# Patient Record
Sex: Female | Born: 1970 | Race: White | Hispanic: No
Health system: Southern US, Community
[De-identification: ages and names within clinical notes are randomized; demographics above are authoritative.]

## PROBLEM LIST (undated history)

## (undated) DIAGNOSIS — K5909 Other constipation: Secondary | ICD-10-CM

## (undated) DIAGNOSIS — K625 Hemorrhage of anus and rectum: Secondary | ICD-10-CM

## (undated) HISTORY — DX: Other constipation: K59.09

## (undated) HISTORY — DX: Hemorrhage of anus and rectum: K62.5

---

## 2010-05-15 ENCOUNTER — Inpatient Hospital Stay (HOSPITAL_COMMUNITY): Admission: EM | Admit: 2010-05-15 | Discharge: 2010-05-20 | Payer: Self-pay | Admitting: Internal Medicine

## 2010-06-04 ENCOUNTER — Encounter
Admission: RE | Admit: 2010-06-04 | Discharge: 2010-06-04 | Payer: Self-pay | Source: Home / Self Care | Admitting: Neurosurgery

## 2010-07-09 ENCOUNTER — Ambulatory Visit (HOSPITAL_COMMUNITY)
Admission: RE | Admit: 2010-07-09 | Discharge: 2010-07-11 | Payer: Self-pay | Source: Home / Self Care | Attending: Neurosurgery | Admitting: Neurosurgery

## 2010-07-14 LAB — CBC
HCT: 39.4 % (ref 36.0–46.0)
Hemoglobin: 13.1 g/dL (ref 12.0–15.0)
MCH: 27.8 pg (ref 26.0–34.0)
MCHC: 33.2 g/dL (ref 30.0–36.0)
MCV: 83.7 fL (ref 78.0–100.0)
Platelets: 156 10*3/uL (ref 150–400)
RBC: 4.71 MIL/uL (ref 3.87–5.11)
RDW: 13.9 % (ref 11.5–15.5)
WBC: 8.1 10*3/uL (ref 4.0–10.5)

## 2010-07-14 LAB — SURGICAL PCR SCREEN
MRSA, PCR: NEGATIVE
Staphylococcus aureus: NEGATIVE

## 2010-07-19 NOTE — Op Note (Signed)
  Rhonda Hendricks, Rhonda Hendricks                   ACCOUNT NO.:  [id number]  MEDICAL RECORD NO.:  [id number]          PATIENT TYPE:  OIB  LOCATION:  3535                         FACILITY:  [facility]  PHYSICIAN:  Coletta Memos, M.D.     DATE OF BIRTH:  01/07/1971  DATE OF PROCEDURE:  07/09/2010 DATE OF DISCHARGE:                              OPERATIVE REPORT   PREOPERATIVE DIAGNOSES: 1. Displaced disk, L4-5 of the left. 2. Left L5 radiculopathy.  POSTOPERATIVE DIAGNOSES: 1. Displaced disk, L4-5 of the left. 2. Left L5 radiculopathy.  PROCEDURE:  Left L4-5, semi-hemilaminectomy, diskectomy with microdissection.  COMPLICATIONS:  None.  SURGEON:  Coletta Memos, MD  ASSISTANT:  Danae Orleans. Venetia Maxon, MD  ANESTHESIA:  General endotracheal.  INDICATIONS:  [name] presents after undergoing epidural injections for fairly large herniated disk at L4-5 on the left side.  They have not been too helpful and she has now decided to undergo operative decompression.  OPERATIVE NOTE:  [name] was brought to the operating room, intubated and placed under general anesthetic without difficulty.  She was rolled prone onto a Wilson frame and all pressure points were properly padded. Back was prepped.  She was draped in a sterile fashion.  I then made my incision and took this down through significant subcutaneous fat to the thoracolumbar fascia.  I exposed the lamina of L5 and L4.  X-ray confirmed my interlaminar location at L4-5.  I then proceeded with a semi-hemilaminectomy of L4 using Kerrison punches and the dura with Dr. Fredrich Birks assistance.  We brought in the microscope and with microdissection retracted thecal sac, identified the disk space, and proceeded to remove disk material.  This took quite some time as there was a large piece of disk which was adherent to the annulus.  Nevertheless, I achieved good decompression of the nerve root and of the thecal sac.  I then irrigated the wound.  I then closed  wound in layered fashion using Vicryl sutures, reapproximated the thoracolumbar subcutaneous tissues.  I used Dermabond for sterile dressing.          ______________________________ Coletta Memos, M.D.     KC/MEDQ  D:  07/09/2010  T:  07/10/2010  Job:  [id number]  Electronically Signed by Coletta Memos M.D. on 07/19/2010 10:47:35 AM

## 2010-07-19 NOTE — Discharge Summary (Signed)
  NAMEKAYA, Rhonda Hendricks                   ACCOUNT NO.:  [id number]  MEDICAL RECORD NO.:  [id number]          PATIENT TYPE:  OIB  LOCATION:  3535                         FACILITY:  [facility]  PHYSICIAN:  Coletta Memos, M.D.     DATE OF BIRTH:  Mar 03, 1971  DATE OF ADMISSION:  07/09/2010 DATE OF DISCHARGE:  07/11/2010                              DISCHARGE SUMMARY   ADMITTING DIAGNOSIS:  Displaced disk, L4-5 on the left.  DISCHARGE DIAGNOSIS:  Displaced disk, L4-5 on the left.  PROCEDURE:  Left L4-5 semi-hemilaminectomy and diskectomy with microdissection.  COMPLICATIONS:  None.  DISPOSITION:  The patient will be returning to her home.  MEDICATIONS:  Hydrocodone and cyclobenzaprine.  Wound clean, dry, no signs of infection.  She is voiding, tolerating a regular diet, and ambulating without difficulty.  [name] will call the office for an appointment in approximately 3-4 weeks.          ______________________________ Coletta Memos, M.D.  KC/MEDQ  D:  07/11/2010  T:  07/12/2010  Job:  [id number]  Electronically Signed by Coletta Memos M.D. on 07/19/2010 10:47:33 AM

## 2010-09-09 LAB — URINALYSIS, ROUTINE W REFLEX MICROSCOPIC
Bilirubin Urine: NEGATIVE
Glucose, UA: NEGATIVE mg/dL
Glucose, UA: NEGATIVE mg/dL
Ketones, ur: 15 mg/dL — AB
Ketones, ur: NEGATIVE mg/dL
Leukocytes, UA: NEGATIVE
Nitrite: NEGATIVE
Nitrite: POSITIVE — AB
Protein, ur: 100 mg/dL — AB
Protein, ur: NEGATIVE mg/dL
Specific Gravity, Urine: 1.012 (ref 1.005–1.030)
Specific Gravity, Urine: 1.036 — ABNORMAL HIGH (ref 1.005–1.030)
Urobilinogen, UA: 1 mg/dL (ref 0.0–1.0)
Urobilinogen, UA: 1 mg/dL (ref 0.0–1.0)
pH: 5.5 (ref 5.0–8.0)
pH: 7.5 (ref 5.0–8.0)

## 2010-09-09 LAB — URINE MICROSCOPIC-ADD ON

## 2010-09-09 LAB — CBC
HCT: 34.4 % — ABNORMAL LOW (ref 36.0–46.0)
HCT: 35.4 % — ABNORMAL LOW (ref 36.0–46.0)
HCT: 38.8 % (ref 36.0–46.0)
Hemoglobin: 11.5 g/dL — ABNORMAL LOW (ref 12.0–15.0)
Hemoglobin: 11.9 g/dL — ABNORMAL LOW (ref 12.0–15.0)
Hemoglobin: 13.2 g/dL (ref 12.0–15.0)
MCH: 27.8 pg (ref 26.0–34.0)
MCH: 27.9 pg (ref 26.0–34.0)
MCH: 27.9 pg (ref 26.0–34.0)
MCHC: 33.4 g/dL (ref 30.0–36.0)
MCHC: 33.6 g/dL (ref 30.0–36.0)
MCHC: 34 g/dL (ref 30.0–36.0)
MCV: 82 fL (ref 78.0–100.0)
MCV: 82.8 fL (ref 78.0–100.0)
MCV: 83.2 fL (ref 78.0–100.0)
Platelets: 148 10*3/uL — ABNORMAL LOW (ref 150–400)
Platelets: 177 10*3/uL (ref 150–400)
Platelets: 179 10*3/uL (ref 150–400)
RBC: 4.13 MIL/uL (ref 3.87–5.11)
RBC: 4.28 MIL/uL (ref 3.87–5.11)
RBC: 4.73 MIL/uL (ref 3.87–5.11)
RDW: 14.2 % (ref 11.5–15.5)
RDW: 14.3 % (ref 11.5–15.5)
RDW: 14.4 % (ref 11.5–15.5)
WBC: 6 10*3/uL (ref 4.0–10.5)
WBC: 7.5 10*3/uL (ref 4.0–10.5)
WBC: 8.6 10*3/uL (ref 4.0–10.5)

## 2010-09-09 LAB — URINE CULTURE
Colony Count: NO GROWTH
Culture  Setup Time: 201111200243
Culture: NO GROWTH
Special Requests: NEGATIVE

## 2010-09-09 LAB — BASIC METABOLIC PANEL
BUN: 11 mg/dL (ref 6–23)
BUN: 13 mg/dL (ref 6–23)
BUN: 21 mg/dL (ref 6–23)
CO2: 25 mEq/L (ref 19–32)
CO2: 29 mEq/L (ref 19–32)
CO2: 30 mEq/L (ref 19–32)
Calcium: 8.2 mg/dL — ABNORMAL LOW (ref 8.4–10.5)
Calcium: 8.5 mg/dL (ref 8.4–10.5)
Calcium: 8.8 mg/dL (ref 8.4–10.5)
Chloride: 100 mEq/L (ref 96–112)
Chloride: 103 mEq/L (ref 96–112)
Chloride: 106 mEq/L (ref 96–112)
Creatinine, Ser: 0.55 mg/dL (ref 0.4–1.2)
Creatinine, Ser: 0.63 mg/dL (ref 0.4–1.2)
Creatinine, Ser: 0.66 mg/dL (ref 0.4–1.2)
GFR calc Af Amer: >60 mL/min (ref >60)
GFR calc Af Amer: >60 mL/min (ref >60)
GFR calc Af Amer: >60 mL/min (ref >60)
GFR calc non Af Amer: >60 mL/min (ref >60)
GFR calc non Af Amer: >60 mL/min (ref >60)
GFR calc non Af Amer: >60 mL/min (ref >60)
Glucose, Bld: 108 mg/dL — ABNORMAL HIGH (ref 70–99)
Glucose, Bld: 108 mg/dL — ABNORMAL HIGH (ref 70–99)
Glucose, Bld: 217 mg/dL — ABNORMAL HIGH (ref 70–99)
Potassium: 3.6 mEq/L (ref 3.5–5.1)
Potassium: 3.9 mEq/L (ref 3.5–5.1)
Potassium: 4.2 mEq/L (ref 3.5–5.1)
Sodium: 134 mEq/L — ABNORMAL LOW (ref 135–145)
Sodium: 139 mEq/L (ref 135–145)
Sodium: 139 mEq/L (ref 135–145)

## 2010-09-09 LAB — PROTIME-INR
INR: 1.04 (ref 0.00–1.49)
Prothrombin Time: 13.8 seconds (ref 11.6–15.2)

## 2010-09-09 LAB — APTT: aPTT: 32 seconds (ref 24–37)

## 2010-10-22 ENCOUNTER — Other Ambulatory Visit: Payer: Self-pay | Admitting: Neurosurgery

## 2010-10-22 DIAGNOSIS — M5126 Other intervertebral disc displacement, lumbar region: Secondary | ICD-10-CM

## 2010-10-26 ENCOUNTER — Ambulatory Visit
Admission: RE | Admit: 2010-10-26 | Discharge: 2010-10-26 | Disposition: A | Discharge status: Home / Self Care | Payer: Medicaid Other | Source: Ambulatory Visit | Attending: Neurosurgery | Admitting: Neurosurgery

## 2010-10-26 DIAGNOSIS — M5126 Other intervertebral disc displacement, lumbar region: Secondary | ICD-10-CM

## 2010-10-26 MED ORDER — GADOBENATE DIMEGLUMINE 529 MG/ML IV SOLN
20.0000 mL | Freq: Once | INTRAVENOUS | Status: AC | PRN
  Administered 2010-10-26: 20 mL via INTRAVENOUS

## 2010-11-10 ENCOUNTER — Other Ambulatory Visit: Payer: Self-pay | Admitting: Neurosurgery

## 2010-11-10 DIAGNOSIS — M545 Low back pain, unspecified: Secondary | ICD-10-CM

## 2010-11-10 DIAGNOSIS — M5126 Other intervertebral disc displacement, lumbar region: Secondary | ICD-10-CM

## 2010-11-12 ENCOUNTER — Ambulatory Visit
Admission: RE | Admit: 2010-11-12 | Discharge: 2010-11-12 | Disposition: A | Discharge status: Home / Self Care | Payer: Medicaid Other | Source: Ambulatory Visit | Attending: Neurosurgery | Admitting: Neurosurgery

## 2010-11-12 DIAGNOSIS — M5126 Other intervertebral disc displacement, lumbar region: Secondary | ICD-10-CM

## 2010-11-12 DIAGNOSIS — M545 Low back pain, unspecified: Secondary | ICD-10-CM

## 2010-11-17 ENCOUNTER — Ambulatory Visit (INDEPENDENT_AMBULATORY_CARE_PROVIDER_SITE_OTHER): Payer: Medicaid Other | Admitting: Internal Medicine

## 2010-11-17 DIAGNOSIS — K625 Hemorrhage of anus and rectum: Secondary | ICD-10-CM

## 2010-11-17 LAB — CBC AND DIFFERENTIAL: Platelets: 175 10*3/uL (ref 150–399)

## 2010-11-27 ENCOUNTER — Ambulatory Visit (HOSPITAL_COMMUNITY)
Admission: RE | Admit: 2010-11-27 | Discharge: 2010-11-27 | Disposition: A | Discharge status: Home / Self Care | Payer: Medicaid Other | Source: Ambulatory Visit | Attending: Internal Medicine | Admitting: Internal Medicine

## 2010-11-27 ENCOUNTER — Encounter (HOSPITAL_BASED_OUTPATIENT_CLINIC_OR_DEPARTMENT_OTHER): Payer: Medicaid Other | Admitting: Internal Medicine

## 2010-11-27 DIAGNOSIS — R198 Other specified symptoms and signs involving the digestive system and abdomen: Secondary | ICD-10-CM

## 2010-11-27 DIAGNOSIS — K644 Residual hemorrhoidal skin tags: Secondary | ICD-10-CM

## 2010-11-27 DIAGNOSIS — K625 Hemorrhage of anus and rectum: Secondary | ICD-10-CM | POA: Insufficient documentation

## 2010-11-27 HISTORY — PX: COLONOSCOPY: SHX174

## 2010-12-05 ENCOUNTER — Other Ambulatory Visit: Payer: Self-pay | Admitting: Neurosurgery

## 2010-12-05 DIAGNOSIS — M5126 Other intervertebral disc displacement, lumbar region: Secondary | ICD-10-CM

## 2010-12-08 ENCOUNTER — Ambulatory Visit
Admission: RE | Admit: 2010-12-08 | Discharge: 2010-12-08 | Disposition: A | Discharge status: Home / Self Care | Payer: Medicaid Other | Source: Ambulatory Visit | Attending: Neurosurgery | Admitting: Neurosurgery

## 2010-12-08 DIAGNOSIS — M5126 Other intervertebral disc displacement, lumbar region: Secondary | ICD-10-CM

## 2010-12-08 NOTE — Consult Note (Signed)
Rhonda Hendricks, Rhonda Hendricks                   ACCOUNT NO.:  [id number]  MEDICAL RECORD NO.:  [id number]           PATIENT TYPE:  LOCATION:                                 FACILITY:  PHYSICIAN:  Lionel December, M.D.    DATE OF BIRTH:  02/05/1971  DATE OF CONSULTATION:  11/17/2010 DATE OF DISCHARGE:                                CONSULTATION  REASON FOR VISIT: Rectal Bleeding.   HISTORY OF PRESENT ILLNESS:  Rhonda Hendricks is a 40 year old female referred to our office by Dr. Dimas Aguas for rectal bleed.  She states she has had rectal bleeding since October.  She states at times her stools or burgundy and some are dark.  She has noticed blood in the toilet and some on the stools.  She states she used to have a bowel movement daily, but now has a bowel movement every 2-3 days.  She does state she feels pressure in the rectum at times.  At times, she also feels something leaking from her rectum, which appears to be mucous, pink in color.  She does say she has not seen blood in 10 days.  She does complain of some left flank pain at times.  There has been no weight loss.  Her appetite is good. No constipation or diarrhea.  There has been no weight loss.  She does tell me her last menstrual period is presently now.  She is allergic to SULFA.  HOME MEDICATIONS:  She is on Advil 600 mg t.i.d. and she has been on this 6 weeks.  Her rectal bleeding started before starting the Advil.  SURGERY:  She had a C-section in 1994, and she had a back surgery in January of this year.  She denies any medical problems.  Her mother is alive in good health with a history of skin cancer to her nose.  Father is in good health.  Three half brothers in good health.  She is separated.  She works at [employer], but presently is not due to her back surgery.  She does not smoke, drink, or do drugs, and she has one child in good health.  There is no family history of colon cancer.  She did have a grandmother with breast  cancer.  OBJECTIVE:  VITAL SIGNS:  Her weight is 309; her height is 5 feet 7 inches; her temperature is 99.2; her blood pressure is 184/100, and please note that this cuff was too small for her arm; her pulse was 88. HEENT:  She has natural teeth.  Her oral mucosa is moist.  There are no lesions.  Her conjunctivae are pink.  Her sclerae are anicteric. NECK:  Her thyroid is normal.  There is no cervical lymphadenopathy. LUNGS:  Clear. ABDOMEN:  Obese.  Her bowel sounds are positive.  No masses felt.  There is no tenderness.  I was unable to palpate her liver due to her obesity. Her stool was brown and appeared to have blood in it, and it was guaiac positive.  ASSESSMENT:  Rhonda Hendricks is a 40 year old female presenting today with a recent history of rectal bleeding.  A colonic neoplasm, polyp, AVM need to be ruled out.  RECOMMENDATIONS:  I have asked her to stop the Advil.  I will get a CBC on her today.  We will also schedule a colonoscopy with Dr. Karilyn Cota in the near future, and the risk and benefits were reviewed with the patient, and the procedure was explained and she is agreeable.Thank you for allowing [place] to participate in the care of this nice lady.    ______________________________ Dorene Ar, NP   ______________________________ Lionel December, M.D.    TS/MEDQ  D:  11/17/2010  T:  11/18/2010  Job:  [id number]  cc:   Selinda Flavin, MD Fax: [phone]  Electronically Signed by Dorene Ar PA on 11/21/2010 09:41:35 AM Electronically Signed by Lionel December M.D. on 12/08/2010 11:24:39 AM

## 2010-12-08 NOTE — Op Note (Signed)
  Rhonda Hendricks, Rhonda Hendricks                   ACCOUNT NO.:  [id number]  MEDICAL RECORD NO.:  [id number]           PATIENT TYPE:  O  LOCATION:  DAYP                          FACILITY:  [facility]  PHYSICIAN:  Lionel December, M.D.    DATE OF BIRTH:  11/03/1970  DATE OF PROCEDURE:  11/27/2010 DATE OF DISCHARGE:                              OPERATIVE REPORT   PROCEDURE:  Colonoscopy.  INDICATION:  Rhonda Hendricks is a 40 year old Caucasian female who has been experiencing intermittent rectal bleeding over the last 6 months.  It occurs with bowel movements and also spontaneously.  She also has noted change in a bowel habits and constipation.  She has been taking Advil 1.8 g per day for back problems.  She says since she has cut back on the dose, she is actually having less bleeding episodes.  She is undergoing diagnostic colonoscopy.  Procedure risks were reviewed with the patient and informed consent was obtained.  MEDICATIONS FOR CONSCIOUS SEDATION: 1. Demerol 50 mg IV. 2. Versed 15 mg IV.  FINDINGS:  Procedure performed in endoscopy suite.  The patient's vital signs and O2 sats were monitored during the procedure and remained stable.  The patient was placed in left lateral recumbent position and rectal examination performed.  No abnormality noted on external or digital exam.  Pentax videoscope was placed through rectum and advanced under vision into sigmoid colon and beyond.  Preparation was excellent. Scope was passed into cecum which was identified by appendiceal orifice and ileocecal valve.  Pictures were taken for the record.  As the scope was withdrawn, colonic mucosa was carefully examined and no polyps, tumor masses or other mucosal abnormalities were noted.  Rectal mucosa was normal.  Scope was retroflexed to examine anorectal junction and prominent hemorrhoids noted below the dentate line.  Endoscope was straightened and withdrawn.  The patient tolerated the procedure well. Withdrawal time was 12  minutes.  FINAL DIAGNOSES: 1. External hemorrhoids, otherwise normal colonoscopy. 2. Suspect her rectal bleeding is secondary to hemorrhoids and     possibly triggered by constipation.  RECOMMENDATIONS: 1. High-fiber diet plus fiber supplement 4 g daily. 2. Colace 2 tablets at bedtime. 3. Anusol-HC suppository one per rectum at bedtime daily for 2 weeks. 4. She will keep rectal bleeding episodes and will return for OV in 3     months.          ______________________________ Lionel December, M.D.     NR/MEDQ  D:  11/27/2010  T:  11/28/2010  Job:  [id number]  cc:   Selinda Flavin, MD Fax: [phone]  Electronically Signed by Lionel December M.D. on 12/08/2010 11:25:10 AM

## 2011-01-13 ENCOUNTER — Other Ambulatory Visit (HOSPITAL_COMMUNITY): Payer: Self-pay | Admitting: Neurosurgery

## 2011-01-13 DIAGNOSIS — M5126 Other intervertebral disc displacement, lumbar region: Secondary | ICD-10-CM

## 2011-01-19 ENCOUNTER — Other Ambulatory Visit (HOSPITAL_COMMUNITY): Payer: Self-pay | Admitting: Neurosurgery

## 2011-01-19 ENCOUNTER — Other Ambulatory Visit (HOSPITAL_COMMUNITY): Payer: Medicaid Other

## 2011-01-19 ENCOUNTER — Ambulatory Visit (HOSPITAL_COMMUNITY): Payer: Medicaid Other

## 2011-01-19 DIAGNOSIS — M5126 Other intervertebral disc displacement, lumbar region: Secondary | ICD-10-CM

## 2011-01-28 ENCOUNTER — Ambulatory Visit (HOSPITAL_COMMUNITY)
Admission: RE | Admit: 2011-01-28 | Discharge: 2011-01-28 | Disposition: A | Discharge status: Home / Self Care | Payer: Medicaid Other | Source: Ambulatory Visit | Attending: Neurosurgery | Admitting: Neurosurgery

## 2011-01-28 DIAGNOSIS — M5126 Other intervertebral disc displacement, lumbar region: Secondary | ICD-10-CM

## 2011-01-28 DIAGNOSIS — R109 Unspecified abdominal pain: Secondary | ICD-10-CM | POA: Insufficient documentation

## 2011-01-28 DIAGNOSIS — M5124 Other intervertebral disc displacement, thoracic region: Secondary | ICD-10-CM | POA: Insufficient documentation

## 2011-01-28 MED ORDER — IOHEXOL 180 MG/ML  SOLN
16.0000 mL | Freq: Once | INTRAMUSCULAR | Status: AC | PRN
  Administered 2011-01-28: 16 mL via INTRAVENOUS

## 2011-02-02 ENCOUNTER — Encounter (INDEPENDENT_AMBULATORY_CARE_PROVIDER_SITE_OTHER): Payer: Self-pay | Admitting: *Deleted

## 2011-02-03 ENCOUNTER — Encounter (INDEPENDENT_AMBULATORY_CARE_PROVIDER_SITE_OTHER): Payer: Self-pay

## 2011-03-10 ENCOUNTER — Ambulatory Visit (INDEPENDENT_AMBULATORY_CARE_PROVIDER_SITE_OTHER): Payer: Medicaid Other | Admitting: Internal Medicine

## 2019-09-08 ENCOUNTER — Ambulatory Visit: Payer: Self-pay

## 2020-02-26 ENCOUNTER — Telehealth: Payer: Self-pay | Admitting: Physician Assistant

## 2020-02-26 NOTE — Telephone Encounter (Signed)
Patient is calling for referral appointment from [facility].  Patient is scheduled for 07/30/2020 at 10:30 (arrival 10:15) with Mackey Birchwood, PAC.

## 2020-07-30 ENCOUNTER — Ambulatory Visit: Payer: Self-pay | Admitting: Physician Assistant

## 2022-06-01 ENCOUNTER — Other Ambulatory Visit: Payer: Self-pay | Admitting: Family Medicine

## 2022-06-01 DIAGNOSIS — M1711 Unilateral primary osteoarthritis, right knee: Secondary | ICD-10-CM

## 2022-06-27 ENCOUNTER — Ambulatory Visit
Admission: RE | Admit: 2022-06-27 | Discharge: 2022-06-27 | Disposition: A | Discharge status: Home / Self Care | Payer: Self-pay | Source: Ambulatory Visit | Attending: Family Medicine | Admitting: Family Medicine

## 2022-06-27 DIAGNOSIS — M1711 Unilateral primary osteoarthritis, right knee: Secondary | ICD-10-CM

## 2024-03-27 ENCOUNTER — Other Ambulatory Visit: Payer: Self-pay | Admitting: Family Medicine

## 2024-03-27 DIAGNOSIS — Z1231 Encounter for screening mammogram for malignant neoplasm of breast: Secondary | ICD-10-CM

## 2024-04-06 ENCOUNTER — Encounter: Payer: Self-pay | Admitting: Family Medicine

## 2024-04-06 DIAGNOSIS — Z1231 Encounter for screening mammogram for malignant neoplasm of breast: Secondary | ICD-10-CM
